# Patient Record
Sex: Female | Born: 1990 | Race: White | Hispanic: No | Marital: Single | Smoking: Never smoker
Health system: Southern US, Community
[De-identification: ages and names within clinical notes are randomized; demographics above are authoritative.]

## PROBLEM LIST (undated history)

## (undated) DIAGNOSIS — T7840XA Allergy, unspecified, initial encounter: Secondary | ICD-10-CM

## (undated) DIAGNOSIS — F32A Depression, unspecified: Secondary | ICD-10-CM

## (undated) DIAGNOSIS — F329 Major depressive disorder, single episode, unspecified: Secondary | ICD-10-CM

## (undated) DIAGNOSIS — F419 Anxiety disorder, unspecified: Secondary | ICD-10-CM

## (undated) HISTORY — DX: Allergy, unspecified, initial encounter: T78.40XA

## (undated) HISTORY — PX: WISDOM TOOTH EXTRACTION: SHX21

## (undated) HISTORY — DX: Depression, unspecified: F32.A

## (undated) HISTORY — DX: Anxiety disorder, unspecified: F41.9

## (undated) HISTORY — DX: Major depressive disorder, single episode, unspecified: F32.9

---

## 2010-11-14 ENCOUNTER — Inpatient Hospital Stay (INDEPENDENT_AMBULATORY_CARE_PROVIDER_SITE_OTHER)
Admission: RE | Admit: 2010-11-14 | Discharge: 2010-11-14 | Disposition: A | Discharge status: Home / Self Care | Payer: BC Managed Care – PPO | Source: Ambulatory Visit | Attending: Family Medicine | Admitting: Family Medicine

## 2010-11-14 DIAGNOSIS — J019 Acute sinusitis, unspecified: Secondary | ICD-10-CM

## 2010-11-14 DIAGNOSIS — J069 Acute upper respiratory infection, unspecified: Secondary | ICD-10-CM

## 2012-07-31 ENCOUNTER — Encounter: Payer: Self-pay | Admitting: Internal Medicine

## 2012-07-31 ENCOUNTER — Ambulatory Visit (INDEPENDENT_AMBULATORY_CARE_PROVIDER_SITE_OTHER): Payer: BC Managed Care – PPO | Admitting: Internal Medicine

## 2012-07-31 VITALS — BP 102/76 | HR 69 | Temp 98.3°F | Ht 68.75 in | Wt 259.0 lb

## 2012-07-31 DIAGNOSIS — F32A Depression, unspecified: Secondary | ICD-10-CM | POA: Insufficient documentation

## 2012-07-31 DIAGNOSIS — R635 Abnormal weight gain: Secondary | ICD-10-CM | POA: Insufficient documentation

## 2012-07-31 DIAGNOSIS — F329 Major depressive disorder, single episode, unspecified: Secondary | ICD-10-CM

## 2012-07-31 DIAGNOSIS — N946 Dysmenorrhea, unspecified: Secondary | ICD-10-CM

## 2012-07-31 LAB — LIPID PANEL
HDL: 57.7 mg/dL (ref >39.00)
Total CHOL/HDL Ratio: 3
VLDL: 15.8 mg/dL (ref 0.0–40.0)

## 2012-07-31 LAB — CBC WITH DIFFERENTIAL/PLATELET
Eosinophils Absolute: 0.3 10*3/uL (ref 0.0–0.7)
MCHC: 34.2 g/dL (ref 30.0–36.0)
MCV: 90.9 fl (ref 78.0–100.0)
Monocytes Absolute: 0.4 10*3/uL (ref 0.1–1.0)
Neutrophils Relative %: 60.9 % (ref 43.0–77.0)
Platelets: 276 10*3/uL (ref 150.0–400.0)
RDW: 13.4 % (ref 11.5–14.6)

## 2012-07-31 LAB — COMPREHENSIVE METABOLIC PANEL
ALT: 64 U/L — ABNORMAL HIGH (ref 0–35)
AST: 77 U/L — ABNORMAL HIGH (ref 0–37)
Albumin: 3.5 g/dL (ref 3.5–5.2)
Alkaline Phosphatase: 114 U/L (ref 39–117)
Glucose, Bld: 92 mg/dL (ref 70–99)
Potassium: 4.5 mEq/L (ref 3.5–5.1)
Sodium: 137 mEq/L (ref 135–145)
Total Protein: 6.9 g/dL (ref 6.0–8.3)

## 2012-07-31 LAB — HEMOGLOBIN A1C: Hgb A1c MFr Bld: 6 % (ref 4.6–6.5)

## 2012-07-31 LAB — TSH: TSH: 3.26 u[IU]/mL (ref 0.35–5.50)

## 2012-07-31 MED ORDER — DESOGESTREL-ETHINYL ESTRADIOL 0.15-30 MG-MCG PO TABS: 1.0000 | ORAL_TABLET | Freq: Every day | ORAL | Status: DC

## 2012-07-31 MED ORDER — FLUOXETINE HCL 20 MG PO CAPS: 20.0000 mg | ORAL_CAPSULE | Freq: Every day | ORAL | Status: DC

## 2012-07-31 MED ORDER — BUPROPION HCL ER (XL) 300 MG PO TB24
300.0000 mg | ORAL_TABLET | ORAL | Status: DC
Start: 2012-07-31 — End: 2012-11-06

## 2012-07-31 MED ORDER — LAMOTRIGINE 25 MG PO TABS: 25.0000 mg | ORAL_TABLET | Freq: Two times a day (BID) | ORAL | Status: DC

## 2012-07-31 NOTE — Assessment & Plan Note (Signed)
Will request records from previous psychiatrist. Will set up psychiatric referral locally. For now, continue current medications. Pt is comfortable with this plan.

## 2012-07-31 NOTE — Assessment & Plan Note (Signed)
Previous symptoms of dysmenorrhea, well-controlled with use of oral contraceptive. Will continue.

## 2012-07-31 NOTE — Progress Notes (Signed)
Subjective:    Patient ID: Deanna Ferguson, female    DOB: April 16, 1990, 22 y.o.   MRN: [id number]  HPI 22 year old female with history of anxiety, depression, and dysmenorrhea presents to establish care. She reports that symptoms of anxiety and depression have been poorly controlled on current medication. She was followed by a psychiatrist in [place] through her school [employer] 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 She would like to establish psychiatric care locally.  She also reports a history of dysmenorrhea for which she is taking oral contraceptives. She reports significant improvement in cramping and bleeding with use of oral contraceptive. She denies any side effects from this medication.  She is concerned about weight gain over the last 6 months. She reports "significant" weight gain despite maintaining the same diet. She is not physically active. She notes that stretch marks have developed on her arms and legs. She plans to start focusing more on her diet and exercise regimen when she moves home the summer.  Outpatient Encounter Prescriptions as of 07/31/2012  Medication Sig Dispense Refill  . buPROPion (WELLBUTRIN XL) 300 MG 24 hr tablet Take 1 tablet (300 mg total) by mouth every morning.  90 tablet  3  . desogestrel-ethinyl estradiol (APRI) 0.15-30 MG-MCG tablet Take 1 tablet by mouth daily.  3 Package  4  . FLUoxetine (PROZAC) 20 MG capsule Take 1 capsule (20 mg total) by mouth daily.  90 capsule  3  . lamoTRIgine (LAMICTAL) 25 MG tablet Take 1 tablet (25 mg total) by mouth 2 (two) times daily.  180 tablet  3   No facility-administered encounter medications on file as of 07/31/2012.   BP 102/76  Pulse 69  Temp(Src) 98.3 F (36.8 C) (Oral)  Ht 5' 8.75" (1.746 m)  Wt 259 lb (117.482 kg)  BMI 38.54 kg/m2  SpO2 98%  LMP 07/23/2012  Review of Systems  Constitutional: Negative for fever, chills, appetite change, fatigue and  unexpected weight change.  HENT: Negative for ear pain, congestion, sore throat, trouble swallowing, neck pain, voice change and sinus pressure.   Eyes: Negative for visual disturbance.  Respiratory: Negative for cough, shortness of breath, wheezing and stridor.   Cardiovascular: Negative for chest pain, palpitations and leg swelling.  Gastrointestinal: Negative for nausea, vomiting, abdominal pain, diarrhea, constipation, blood in stool, abdominal distention and anal bleeding.  Genitourinary: Negative for dysuria and flank pain.  Musculoskeletal: Negative for myalgias, arthralgias and gait problem.  Skin: Negative for color change and rash.  Neurological: Negative for dizziness and headaches.  Hematological: Negative for adenopathy. Does not bruise/bleed easily.  Psychiatric/Behavioral: Positive for dysphoric mood. Negative for suicidal ideas and sleep disturbance. The patient is nervous/anxious.        Objective:   Physical Exam  Constitutional: She is oriented to person, place, and time. She appears well-developed and well-nourished. No distress.  HENT:  Head: Normocephalic and atraumatic.  Right Ear: External ear normal.  Left Ear: External ear normal.  Nose: Nose normal.  Mouth/Throat: Oropharynx is clear and moist. No oropharyngeal exudate.  Eyes: Conjunctivae are normal. Pupils are equal, round, and reactive to light. Right eye exhibits no discharge. Left eye exhibits no discharge. No scleral icterus.  Neck: Normal range of motion. Neck supple. No tracheal deviation present. No thyromegaly present.  Cardiovascular: Normal rate, regular rhythm, normal heart sounds and intact distal pulses.  Exam reveals no gallop and no friction rub.   No murmur heard.  Pulmonary/Chest: Effort normal and breath sounds normal. No accessory muscle usage. Not tachypneic. No respiratory distress. She has no decreased breath sounds. She has no wheezes. She has no rhonchi. She has no rales. She exhibits no  tenderness.  Musculoskeletal: Normal range of motion. She exhibits no edema and no tenderness.  Lymphadenopathy:    She has no cervical adenopathy.  Neurological: She is alert and oriented to person, place, and time. No cranial nerve deficit. She exhibits normal muscle tone. Coordination normal.  Skin: Skin is warm and dry. No rash noted. She is not diaphoretic. No erythema. No pallor.  Psychiatric: She has a normal mood and affect. Her behavior is normal. Judgment and thought content normal.          Assessment & Plan:

## 2012-07-31 NOTE — Assessment & Plan Note (Signed)
Wt Readings from Last 3 Encounters:  07/31/12 259 lb (117.482 kg)   Body mass index is 38.54 kg/(m^2). Patient notes recent weight gain. Will check thyroid function with labs. Encouraged healthy diet low in processed carbohydrates. Encouraged regular physical activity with a goal of 30 minutes 5 days per week. Discussed potential referral for nutrition counseling.

## 2012-08-04 ENCOUNTER — Telehealth: Payer: Self-pay | Admitting: *Deleted

## 2012-08-04 NOTE — Telephone Encounter (Signed)
Patient mother called, stating that she also had some elevated liver enzymes and the fatty liver but when she did the U/S it was normal. Feel like it is a wasted test, no reason to have test done at this time. Would like to wait until her next office visit and repeat labs before proceeding with this. She has testing and classes to attend to and does not think this is a good time to do this. Her daughter is aware she called, but if you do not feel comfortable addressing this with her then we can call and discuss with her daughter.

## 2012-08-04 NOTE — Telephone Encounter (Signed)
We should communicate with the patient. She is 22 years old. She has elevated liver enzymes. There are many potential causes for this. At the minimum, we should repeat liver function tests in 2-4 weeks. If no improvement, we will need to do additional workup.

## 2012-08-05 NOTE — Telephone Encounter (Signed)
Left message to call back  

## 2012-08-07 ENCOUNTER — Encounter: Payer: Self-pay | Admitting: Emergency Medicine

## 2012-08-10 NOTE — Telephone Encounter (Signed)
Patient informed and verbally agreed to come in for the blood work then decide from there. She is in school that is why she was a little hesitant on the ultrasound.

## 2012-08-20 ENCOUNTER — Ambulatory Visit (INDEPENDENT_AMBULATORY_CARE_PROVIDER_SITE_OTHER): Payer: BC Managed Care – PPO | Admitting: Internal Medicine

## 2012-08-20 ENCOUNTER — Encounter: Payer: Self-pay | Admitting: Internal Medicine

## 2012-08-20 VITALS — BP 118/82 | HR 91 | Temp 98.7°F | Wt 259.0 lb

## 2012-08-20 DIAGNOSIS — J069 Acute upper respiratory infection, unspecified: Secondary | ICD-10-CM | POA: Insufficient documentation

## 2012-08-20 MED ORDER — AMOXICILLIN-POT CLAVULANATE 875-125 MG PO TABS: 1.0000 | ORAL_TABLET | Freq: Two times a day (BID) | ORAL | Status: DC

## 2012-08-20 MED ORDER — HYDROCOD POLST-CHLORPHEN POLST 10-8 MG/5ML PO LQCR: 5.0000 mL | Freq: Two times a day (BID) | ORAL | Status: DC | PRN

## 2012-08-20 NOTE — Assessment & Plan Note (Signed)
Symptoms consistent with viral URI. Rapid strep negative. Encouraged rest, supportive care. Ibuprofen or tylenol for fever or pain. Tussionex for cough. However, given pt h/o strep throat and upcoming Holiday weekend, will get Rx for Augmentin for her to take with her. If symptoms worsen, or if fever recurs, then will have pt start antibiotics and send [place] an email or call to let us know.

## 2012-08-20 NOTE — Progress Notes (Signed)
  Subjective:    Patient ID: Deanna Ferguson, female    DOB: [birth year], 22 y.o.   MRN: [id number]  HPI 22YO female presents for acute visit c/o 3 days of sore throat, fever, nasal congestion, non-productive cough. Fever resolved after first 24hr. No trouble swallowing. Tolerating normal diet. No chest pain, dyspnea. Not taking any medications currently. Took Nyquil initially.  Outpatient Prescriptions Prior to Visit  Medication Sig Dispense Refill  . buPROPion (WELLBUTRIN XL) 300 MG 24 hr tablet Take 1 tablet (300 mg total) by mouth every morning.  90 tablet  3  . desogestrel-ethinyl estradiol (APRI) 0.15-30 MG-MCG tablet Take 1 tablet by mouth daily.  3 Package  4  . FLUoxetine (PROZAC) 20 MG capsule Take 1 capsule (20 mg total) by mouth daily.  90 capsule  3  . lamoTRIgine (LAMICTAL) 25 MG tablet Take 1 tablet (25 mg total) by mouth 2 (two) times daily.  180 tablet  3   No facility-administered medications prior to visit.    Review of Systems  Constitutional: Positive for fever and fatigue. Negative for chills and unexpected weight change.  HENT: Positive for congestion, sore throat and postnasal drip. Negative for hearing loss, ear pain, nosebleeds, facial swelling, rhinorrhea, sneezing, mouth sores, trouble swallowing, neck pain, neck stiffness, voice change, sinus pressure, tinnitus and ear discharge.   Eyes: Negative for pain, discharge, redness and visual disturbance.  Respiratory: Positive for cough. Negative for chest tightness, shortness of breath, wheezing and stridor.   Cardiovascular: Negative for chest pain, palpitations and leg swelling.  Musculoskeletal: Negative for myalgias and arthralgias.  Skin: Negative for color change and rash.  Neurological: Negative for dizziness, weakness, light-headedness and headaches.  Hematological: Negative for adenopathy.       Objective:   Physical Exam  Constitutional: She is oriented to person, place, and time. She appears  well-developed and well-nourished. No distress.  HENT:  Head: Normocephalic and atraumatic.  Right Ear: External ear normal.  Left Ear: External ear normal.  Nose: Nose normal.  Mouth/Throat: Oropharyngeal exudate and posterior oropharyngeal erythema present.  Eyes: Conjunctivae are normal. Pupils are equal, round, and reactive to light. Right eye exhibits no discharge. Left eye exhibits no discharge. No scleral icterus.  Neck: Normal range of motion. Neck supple. No tracheal deviation present. No thyromegaly present.  Cardiovascular: Normal rate, regular rhythm, normal heart sounds and intact distal pulses.  Exam reveals no gallop and no friction rub.   No murmur heard. Pulmonary/Chest: Effort normal and breath sounds normal. No accessory muscle usage. Not tachypneic. No respiratory distress. She has no decreased breath sounds. She has no wheezes. She has no rhonchi. She has no rales. She exhibits no tenderness.  Musculoskeletal: Normal range of motion. She exhibits no edema and no tenderness.  Lymphadenopathy:    She has no cervical adenopathy.  Neurological: She is alert and oriented to person, place, and time. No cranial nerve deficit. She exhibits normal muscle tone. Coordination normal.  Skin: Skin is warm and dry. No rash noted. She is not diaphoretic. No erythema. No pallor.  Psychiatric: She has a normal mood and affect. Her behavior is normal. Judgment and thought content normal.          Assessment & Plan:

## 2012-11-04 ENCOUNTER — Ambulatory Visit (INDEPENDENT_AMBULATORY_CARE_PROVIDER_SITE_OTHER): Payer: BC Managed Care – PPO | Admitting: *Deleted

## 2012-11-04 DIAGNOSIS — Z111 Encounter for screening for respiratory tuberculosis: Secondary | ICD-10-CM

## 2012-11-06 ENCOUNTER — Ambulatory Visit (INDEPENDENT_AMBULATORY_CARE_PROVIDER_SITE_OTHER): Payer: BC Managed Care – PPO | Admitting: Internal Medicine

## 2012-11-06 ENCOUNTER — Encounter: Payer: Self-pay | Admitting: Internal Medicine

## 2012-11-06 VITALS — BP 108/80 | HR 86 | Temp 98.3°F | Ht 68.75 in | Wt 260.0 lb

## 2012-11-06 DIAGNOSIS — N946 Dysmenorrhea, unspecified: Secondary | ICD-10-CM

## 2012-11-06 DIAGNOSIS — E669 Obesity, unspecified: Secondary | ICD-10-CM

## 2012-11-06 DIAGNOSIS — Z0000 Encounter for general adult medical examination without abnormal findings: Secondary | ICD-10-CM

## 2012-11-06 MED ORDER — DESOGESTREL-ETHINYL ESTRADIOL 0.15-30 MG-MCG PO TABS: 1.0000 | ORAL_TABLET | Freq: Every day | ORAL | Status: DC

## 2012-11-06 NOTE — Assessment & Plan Note (Signed)
Encouraged her to keep a food diary with goal of caloric restriction and weight loss. Discussed healthy, Mediterranean style diet. Encouraged regular physical activity 40 minutes of aerobic activity at least 3 times per week.

## 2012-11-06 NOTE — Progress Notes (Signed)
Subjective:    Patient ID: Deanna Ferguson, female    DOB: Jun 17, 1990, 22 y.o.   MRN: [id number]  HPI 22 year old female presents for annual exam. She presents with her mother today. She reports that she is generally feeling well. She continues to undergo care for depression with a psychiatrist. She reports symptoms have been well-controlled on current medications. 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 She continues to live at home. She is not following any particular diet. She does not exercise. No concerns today.  Outpatient Encounter Prescriptions as of 11/06/2012  Medication Sig Dispense Refill  . buPROPion (WELLBUTRIN SR) 150 MG 12 hr tablet Take 150 mg by mouth daily.      Marland Kitchen desogestrel-ethinyl estradiol (APRI) 0.15-30 MG-MCG tablet Take 1 tablet by mouth daily.  3 Package  4  . lamoTRIgine (LAMICTAL) 25 MG tablet Take 1 tablet (25 mg total) by mouth 2 (two) times daily.  180 tablet  3   No facility-administered encounter medications on file as of 11/06/2012.   BP 108/80  Pulse 86  Temp(Src) 98.3 F (36.8 C) (Oral)  Ht 5' 8.75" (1.746 m)  Wt 260 lb (117.935 kg)  BMI 38.69 kg/m2  SpO2 97%  Review of Systems  Constitutional: Negative for fever, chills, appetite change, fatigue and unexpected weight change.  HENT: Negative for ear pain, congestion, sore throat, trouble swallowing, neck pain, voice change and sinus pressure.   Eyes: Negative for visual disturbance.  Respiratory: Negative for cough, shortness of breath, wheezing and stridor.   Cardiovascular: Negative for chest pain, palpitations and leg swelling.  Gastrointestinal: Negative for nausea, vomiting, abdominal pain, diarrhea, constipation, blood in stool, abdominal distention and anal bleeding.  Genitourinary: Negative for dysuria and flank pain.  Musculoskeletal: Negative for myalgias, arthralgias and gait problem.  Skin: Negative for color change and rash.  Neurological: Negative for  dizziness and headaches.  Hematological: Negative for adenopathy. Does not bruise/bleed easily.  Psychiatric/Behavioral: Negative for suicidal ideas, sleep disturbance and dysphoric mood. The patient is not nervous/anxious.        Objective:   Physical Exam  Constitutional: She is oriented to person, place, and time. She appears well-developed and well-nourished. No distress.  HENT:  Head: Normocephalic and atraumatic.  Right Ear: External ear normal.  Left Ear: External ear normal.  Nose: Nose normal.  Mouth/Throat: Oropharynx is clear and moist. No oropharyngeal exudate.  Eyes: Conjunctivae are normal. Pupils are equal, round, and reactive to light. Right eye exhibits no discharge. Left eye exhibits no discharge. No scleral icterus.  Neck: Normal range of motion. Neck supple. No tracheal deviation present. No thyromegaly present.  Cardiovascular: Normal rate, regular rhythm, normal heart sounds and intact distal pulses.  Exam reveals no gallop and no friction rub.   No murmur heard. Pulmonary/Chest: Effort normal and breath sounds normal. No accessory muscle usage. Not tachypneic. No respiratory distress. She has no decreased breath sounds. She has no wheezes. She has no rales. She exhibits no tenderness. Right breast exhibits no inverted nipple, no mass, no nipple discharge, no skin change and no tenderness. Left breast exhibits no inverted nipple, no mass, no nipple discharge, no skin change and no tenderness. Breasts are symmetrical.  Abdominal: Soft. Bowel sounds are normal. She exhibits no distension and no mass. There is no tenderness. There is no rebound and no guarding.  Musculoskeletal: Normal range of motion. She exhibits no edema and no tenderness.  Lymphadenopathy:    She has no cervical adenopathy.  Neurological: She is alert and oriented to person, place, and time. No cranial nerve deficit. She exhibits normal muscle tone. Coordination normal.  Skin: Skin is warm and dry. No  rash noted. She is not diaphoretic. No erythema. No pallor.  Psychiatric: She has a normal mood and affect. Her behavior is normal. Judgment and thought content normal.          Assessment & Plan:

## 2012-11-06 NOTE — Assessment & Plan Note (Signed)
General medical exam normal today including breast exam. Patient declines Pap and pelvic exam. Encouraged to reconsider this given the potential risk of cervical cancer. Immunizations are up-to-date. Recent TB test was negative. Encouraged healthy diet and regular physical activity with goal of weight loss. Follow up 1 year and prn.

## 2013-03-17 ENCOUNTER — Emergency Department: Payer: Self-pay | Admitting: Internal Medicine

## 2013-03-17 LAB — COMPREHENSIVE METABOLIC PANEL
ALT: 160 U/L — AB (ref 12–78)
Albumin: 3.4 g/dL (ref 3.4–5.0)
Alkaline Phosphatase: 157 U/L — ABNORMAL HIGH
Anion Gap: 5 — ABNORMAL LOW (ref 7–16)
BUN: 9 mg/dL (ref 7–18)
Bilirubin,Total: 0.3 mg/dL (ref 0.2–1.0)
CALCIUM: 9 mg/dL (ref 8.5–10.1)
Chloride: 106 mmol/L (ref 98–107)
Co2: 24 mmol/L (ref 21–32)
Creatinine: 0.71 mg/dL (ref 0.60–1.30)
EGFR (African American): >60
Glucose: 87 mg/dL (ref 65–99)
Osmolality: 268 (ref 275–301)
POTASSIUM: 4.1 mmol/L (ref 3.5–5.1)
SGOT(AST): 161 U/L — ABNORMAL HIGH (ref 15–37)
Sodium: 135 mmol/L — ABNORMAL LOW (ref 136–145)
Total Protein: 8 g/dL (ref 6.4–8.2)

## 2013-03-17 LAB — CBC
HCT: 38.3 % (ref 35.0–47.0)
HGB: 13 g/dL (ref 12.0–16.0)
MCH: 31 pg (ref 26.0–34.0)
MCHC: 34 g/dL (ref 32.0–36.0)
MCV: 91 fL (ref 80–100)
Platelet: 284 10*3/uL (ref 150–440)
RBC: 4.2 10*6/uL (ref 3.80–5.20)
RDW: 13.6 % (ref 11.5–14.5)
WBC: 6.4 10*3/uL (ref 3.6–11.0)

## 2013-03-17 LAB — TROPONIN I

## 2013-04-15 ENCOUNTER — Ambulatory Visit: Payer: BC Managed Care – PPO | Admitting: Internal Medicine

## 2013-04-26 ENCOUNTER — Ambulatory Visit (INDEPENDENT_AMBULATORY_CARE_PROVIDER_SITE_OTHER): Payer: BC Managed Care – PPO | Admitting: Internal Medicine

## 2013-04-26 ENCOUNTER — Encounter: Payer: Self-pay | Admitting: Internal Medicine

## 2013-04-26 VITALS — BP 108/80 | HR 89 | Temp 97.6°F | Wt 267.0 lb

## 2013-04-26 DIAGNOSIS — R7989 Other specified abnormal findings of blood chemistry: Secondary | ICD-10-CM | POA: Insufficient documentation

## 2013-04-26 DIAGNOSIS — R932 Abnormal findings on diagnostic imaging of liver and biliary tract: Secondary | ICD-10-CM | POA: Insufficient documentation

## 2013-04-26 DIAGNOSIS — R945 Abnormal results of liver function studies: Principal | ICD-10-CM

## 2013-04-26 DIAGNOSIS — R079 Chest pain, unspecified: Secondary | ICD-10-CM | POA: Insufficient documentation

## 2013-04-26 NOTE — Assessment & Plan Note (Signed)
Recent liver ultrasound showed 2.8cm echogenic lesion in caudate lobe, likely fatty infiltration versus hemangioma. Will plan to set up MRI for better characterization. Will also repeat LFTs.

## 2013-04-26 NOTE — Assessment & Plan Note (Signed)
Recommended repeat LFTs today. Pt prefers to wait until another time because of fear of needles. She will come in later this week.

## 2013-04-26 NOTE — Progress Notes (Signed)
Subjective:    Patient ID: Deanna Ferguson, female    DOB: [birth year], 23 y.o.   MRN: [id number]  HPI 23YO female presents with her mother for follow up after recent ED visit.  Injured at work 1/28th when child threw basketball and it hit her chest. Had chest pain. Went to ED. Evaluation was normal except for elevated LFTs, per her report. Had RUQ [place] which showed echogenic lesion in the caudate lobe of liver measuring 2.8cm. No nausea, vomiting. Occasional cough. No dyspnea. Chest pain has completely resolved.No fever, chills. Recently had upper respiratory infection with cough, congestion. Symptoms have been improving, except for occasional dry cough. Not taking anything for this.    Review of Systems  Constitutional: Negative for fever, chills, appetite change, fatigue and unexpected weight change.  HENT: Negative for congestion, ear pain, sinus pressure, sore throat, trouble swallowing and voice change.   Eyes: Negative for visual disturbance.  Respiratory: Positive for cough (occasaionl dry cough last couple of weeks). Negative for shortness of breath, wheezing and stridor.   Cardiovascular: Negative for chest pain, palpitations and leg swelling.  Gastrointestinal: Negative for nausea, vomiting, abdominal pain, diarrhea, constipation, blood in stool, abdominal distention and anal bleeding.  Genitourinary: Negative for dysuria and flank pain.  Musculoskeletal: Negative for arthralgias, gait problem, myalgias and neck pain.  Skin: Negative for color change and rash.  Neurological: Negative for dizziness and headaches.  Hematological: Negative for adenopathy. Does not bruise/bleed easily.  Psychiatric/Behavioral: Negative for suicidal ideas, sleep disturbance and dysphoric mood. The patient is not nervous/anxious.        Objective:    BP 108/80  Pulse 89  Temp(Src) 97.6 F (36.4 C) (Oral)  Wt 267 lb (121.11 kg)  SpO2 98% Physical Exam  Constitutional: She is oriented to  person, place, and time. She appears well-developed and well-nourished. No distress.  HENT:  Head: Normocephalic and atraumatic.  Right Ear: External ear normal.  Left Ear: External ear normal.  Nose: Nose normal.  Mouth/Throat: Oropharynx is clear and moist. No oropharyngeal exudate.  Eyes: Conjunctivae are normal. Pupils are equal, round, and reactive to light. Right eye exhibits no discharge. Left eye exhibits no discharge. No scleral icterus.  Neck: Normal range of motion. Neck supple. No tracheal deviation present. No thyromegaly present.  Cardiovascular: Normal rate, regular rhythm, normal heart sounds and intact distal pulses.  Exam reveals no gallop and no friction rub.   No murmur heard. Pulmonary/Chest: Effort normal and breath sounds normal. No accessory muscle usage. Not tachypneic. No respiratory distress. She has no decreased breath sounds. She has no wheezes. She has no rhonchi. She has no rales. She exhibits no tenderness.  Abdominal: Soft. Bowel sounds are normal. She exhibits no distension and no mass. There is no hepatosplenomegaly. There is no tenderness. There is no rebound and no guarding.  Musculoskeletal: Normal range of motion. She exhibits no edema and no tenderness.  Lymphadenopathy:    She has no cervical adenopathy.  Neurological: She is alert and oriented to person, place, and time. No cranial nerve deficit. She exhibits normal muscle tone. Coordination normal.  Skin: Skin is warm and dry. No rash noted. She is not diaphoretic. No erythema. No pallor.  Psychiatric: Her speech is normal and behavior is normal. Judgment and thought content normal. Her mood appears anxious. Cognition and memory are normal.          Assessment & Plan:   Problem List Items Addressed This Visit   Abnormal liver  ultrasound     Recent liver ultrasound showed 2.8cm echogenic lesion in caudate lobe, likely fatty infiltration versus hemangioma. Will plan to set up MRI for better  characterization. Will also repeat LFTs.    Relevant Orders      MR Liver W Contrast   Chest pain     Pain has completely resolved. Chest wall contusion after being hit by basketball. Will request labs and evaluation from [facility] ED.    Elevated LFTs - Primary     Recommended repeat LFTs today. Pt prefers to wait until another time because of fear of needles. She will come in later this week.    Relevant Orders      Comp Met (CMET)       Return in about 3 months (around 07/27/2013).

## 2013-04-26 NOTE — Progress Notes (Signed)
Pre visit review using our clinic review tool, if applicable. No additional management support is needed unless otherwise documented below in the visit note. 

## 2013-04-26 NOTE — Assessment & Plan Note (Signed)
Pain has completely resolved. Chest wall contusion after being hit by basketball. Will request labs and evaluation from [facility] ED.

## 2013-04-30 ENCOUNTER — Other Ambulatory Visit: Payer: Self-pay | Admitting: *Deleted

## 2013-04-30 ENCOUNTER — Telehealth: Payer: Self-pay | Admitting: *Deleted

## 2013-04-30 DIAGNOSIS — R740 Nonspecific elevation of levels of transaminase and lactic acid dehydrogenase [LDH]: Principal | ICD-10-CM

## 2013-04-30 DIAGNOSIS — R7402 Elevation of levels of lactic acid dehydrogenase (LDH): Secondary | ICD-10-CM

## 2013-04-30 NOTE — Telephone Encounter (Signed)
Pt is coming in on 03.16.2015 what labs and dx?  

## 2013-04-30 NOTE — Telephone Encounter (Signed)
CMP for 790.4

## 2013-05-03 ENCOUNTER — Other Ambulatory Visit (INDEPENDENT_AMBULATORY_CARE_PROVIDER_SITE_OTHER): Payer: BC Managed Care – PPO

## 2013-05-03 DIAGNOSIS — R7401 Elevation of levels of liver transaminase levels: Secondary | ICD-10-CM

## 2013-05-03 DIAGNOSIS — R740 Nonspecific elevation of levels of transaminase and lactic acid dehydrogenase [LDH]: Principal | ICD-10-CM

## 2013-05-03 DIAGNOSIS — R7402 Elevation of levels of lactic acid dehydrogenase (LDH): Secondary | ICD-10-CM

## 2013-05-03 LAB — COMPREHENSIVE METABOLIC PANEL
ALT: 34 U/L (ref 0–35)
AST: 44 U/L — AB (ref 0–37)
Albumin: 3.8 g/dL (ref 3.5–5.2)
Alkaline Phosphatase: 102 U/L (ref 39–117)
BILIRUBIN TOTAL: 0.3 mg/dL (ref 0.3–1.2)
BUN: 15 mg/dL (ref 6–23)
CO2: 25 mEq/L (ref 19–32)
CREATININE: 0.8 mg/dL (ref 0.4–1.2)
Calcium: 9.3 mg/dL (ref 8.4–10.5)
Chloride: 106 mEq/L (ref 96–112)
GFR: 97.45 mL/min (ref >60.00)
Glucose, Bld: 95 mg/dL (ref 70–99)
Potassium: 3.8 mEq/L (ref 3.5–5.1)
Sodium: 139 mEq/L (ref 135–145)
TOTAL PROTEIN: 7.6 g/dL (ref 6.0–8.3)

## 2013-05-07 ENCOUNTER — Telehealth: Payer: Self-pay | Admitting: Internal Medicine

## 2013-05-07 ENCOUNTER — Ambulatory Visit: Payer: Self-pay | Admitting: Internal Medicine

## 2013-05-07 NOTE — Telephone Encounter (Signed)
MRI abdomen was normal

## 2013-06-14 ENCOUNTER — Encounter: Payer: Self-pay | Admitting: Internal Medicine

## 2013-09-30 ENCOUNTER — Other Ambulatory Visit: Payer: Self-pay | Admitting: Internal Medicine

## 2013-11-05 ENCOUNTER — Encounter: Payer: Self-pay | Admitting: Internal Medicine

## 2013-11-11 ENCOUNTER — Encounter: Payer: Self-pay | Admitting: Internal Medicine

## 2013-11-11 ENCOUNTER — Ambulatory Visit (INDEPENDENT_AMBULATORY_CARE_PROVIDER_SITE_OTHER): Payer: BC Managed Care – PPO | Admitting: Internal Medicine

## 2013-11-11 VITALS — BP 112/72 | HR 90 | Temp 98.0°F | Resp 16 | Ht 69.0 in | Wt 276.0 lb

## 2013-11-11 DIAGNOSIS — N946 Dysmenorrhea, unspecified: Secondary | ICD-10-CM

## 2013-11-11 DIAGNOSIS — Z0000 Encounter for general adult medical examination without abnormal findings: Secondary | ICD-10-CM

## 2013-11-11 DIAGNOSIS — F410 Panic disorder [episodic paroxysmal anxiety] without agoraphobia: Secondary | ICD-10-CM

## 2013-11-11 DIAGNOSIS — G43909 Migraine, unspecified, not intractable, without status migrainosus: Secondary | ICD-10-CM

## 2013-11-11 DIAGNOSIS — E669 Obesity, unspecified: Secondary | ICD-10-CM

## 2013-11-11 MED ORDER — DESOGESTREL-ETHINYL ESTRADIOL 0.15-30 MG-MCG PO TABS
1.0000 | ORAL_TABLET | Freq: Every day | ORAL | Status: DC
Start: 2013-11-11 — End: 2015-03-13

## 2013-11-11 NOTE — Progress Notes (Signed)
Subjective:    Patient ID: Deanna Ferguson, female    DOB: 08-27-1990, 23 y.o.   MRN: [id number]  HPI 23YO female presents for physical exam. Started seeing a therapist. Diagnosed with panic disorder and PTSD. Using a service dog who alerts her to impending panic attacks.  Having severe migraines typically twice per week. Headaches occur in late afternoon. Typically with pain in front of head. Positive photo and phonophobia.  Feels nauseous during headache. Takes Excedrine with some improvement.  Declines pelvic exam today, feels too anxious. Would like to consider pelvic exam under anesthesia.  Review of Systems  Constitutional: Negative for fever, chills, appetite change, fatigue and unexpected weight change.  Eyes: Negative for visual disturbance.  Respiratory: Negative for shortness of breath.   Cardiovascular: Negative for chest pain and leg swelling.  Gastrointestinal: Negative for nausea, vomiting, abdominal pain, diarrhea and constipation.  Genitourinary: Negative for menstrual problem.  Musculoskeletal: Negative for arthralgias and myalgias.  Skin: Negative for color change and rash.  Hematological: Negative for adenopathy. Does not bruise/bleed easily.  Psychiatric/Behavioral: Negative for suicidal ideas, sleep disturbance and dysphoric mood. The patient is nervous/anxious.        Objective:    BP 112/72  Pulse 90  Temp(Src) 98 F (36.7 C) (Oral)  Resp 16  Ht  (1.753 m)  Wt 276 lb (125.193 kg)  BMI 40.74 kg/m2  SpO2 98% Physical Exam  Constitutional: She is oriented to person, place, and time. She appears well-developed and well-nourished. No distress.  HENT:  Head: Normocephalic and atraumatic.  Right Ear: External ear normal.  Left Ear: External ear normal.  Nose: Nose normal.  Mouth/Throat: Oropharynx is clear and moist. No oropharyngeal exudate.  Eyes: Conjunctivae are normal. Pupils are equal, round, and reactive to light. Right eye exhibits  no discharge. Left eye exhibits no discharge. No scleral icterus.  Neck: Normal range of motion. Neck supple. No tracheal deviation present. No thyromegaly present.  Cardiovascular: Normal rate, regular rhythm, normal heart sounds and intact distal pulses.  Exam reveals no gallop and no friction rub.   No murmur heard. Pulmonary/Chest: Effort normal and breath sounds normal. No accessory muscle usage. Not tachypneic. No respiratory distress. She has no decreased breath sounds. She has no wheezes. She has no rhonchi. She has no rales. She exhibits no tenderness.  Musculoskeletal: Normal range of motion. She exhibits no edema and no tenderness.  Lymphadenopathy:    She has no cervical adenopathy.  Neurological: She is alert and oriented to person, place, and time. No cranial nerve deficit. She exhibits normal muscle tone. Coordination normal.  Skin: Skin is warm and dry. No rash noted. She is not diaphoretic. No erythema. No pallor.  Psychiatric: Her speech is normal and behavior is normal. Judgment and thought content normal. Her mood appears anxious. Cognition and memory are normal.          Assessment & Plan:   Problem List Items Addressed This Visit     Unprioritized   Dysmenorrhea   Migraine, unspecified, without mention of intractable migraine without mention of status migrainosus     Recent episodes of migraine type headaches. I am concerned that this may be related to increase in dose of Lamictal. We discussed talking with her psychiatrist about this. We also discuss imaging with MRI brain and we discussed referral to neurology. I am reluctant to add medication given her severe panic disorder. She will discuss with her mother and get back to [place] by phone  or email.    Relevant Medications      lamoTRIgine (LAMICTAL) 25 MG tablet   Obesity (BMI 30-39.9)      Wt Readings from Last 3 Encounters:  11/11/13 276 lb (125.193 kg)  04/26/13 267 lb (121.11 kg)  11/06/12 260 lb (117.935 kg)    Body mass index is 40.74 kg/(m^2).   Encouraged healthy diet and exercise with goal of weight loss.    Panic disorder     Encouraged regular follow up with her psychiatrist. Continue current medications. Letter written today to explain need for service dog.    Routine general medical examination at a health care facility - Primary     General medical exam today normal. Pt declines pelvic exam and PAP given severe anxiety. Will set up referral to GYN for possible exam under anesthesia. She also declines labwork today given severe anxiety about venipunction. Flu vaccine declined.    Relevant Medications      desogestrel-ethinyl estradiol (APRI) 0.15-30 MG-MCG tablet   Other Relevant Orders      Ambulatory referral to Gynecology       Return in about 4 weeks (around 12/09/2013) for Recheck.

## 2013-11-11 NOTE — Assessment & Plan Note (Signed)
Encouraged regular follow up with her psychiatrist. Continue current medications. Letter written today to explain need for service dog.

## 2013-11-11 NOTE — Assessment & Plan Note (Signed)
General medical exam today normal. Pt declines pelvic exam and PAP given severe anxiety. Will set up referral to GYN for possible exam under anesthesia. She also declines labwork today given severe anxiety about venipunction. Flu vaccine declined.

## 2013-11-11 NOTE — Assessment & Plan Note (Signed)
Wt Readings from Last 3 Encounters:  11/11/13 276 lb (125.193 kg)  04/26/13 267 lb (121.11 kg)  11/06/12 260 lb (117.935 kg)   Body mass index is 40.74 kg/(m^2).   Encouraged healthy diet and exercise with goal of weight loss.

## 2013-11-11 NOTE — Assessment & Plan Note (Signed)
Recent episodes of migraine type headaches. I am concerned that this may be related to increase in dose of Lamictal. We discussed talking with her psychiatrist about this. We also discuss imaging with MRI brain and we discussed referral to neurology. I am reluctant to add medication given her severe panic disorder. She will discuss with her mother and get back to [place] by phone or email.

## 2013-11-11 NOTE — Patient Instructions (Signed)

## 2013-11-21 ENCOUNTER — Encounter: Payer: Self-pay | Admitting: Internal Medicine

## 2014-04-30 ENCOUNTER — Encounter: Payer: Self-pay | Admitting: Internal Medicine

## 2015-03-12 ENCOUNTER — Encounter: Payer: Self-pay | Admitting: Internal Medicine

## 2015-03-13 ENCOUNTER — Other Ambulatory Visit: Payer: Self-pay

## 2015-03-13 DIAGNOSIS — Z0000 Encounter for general adult medical examination without abnormal findings: Secondary | ICD-10-CM

## 2015-03-13 MED ORDER — DESOGESTREL-ETHINYL ESTRADIOL 0.15-30 MG-MCG PO TABS: 1.0000 | ORAL_TABLET | Freq: Every day | ORAL | Status: AC

## 2018-05-27 ENCOUNTER — Other Ambulatory Visit: Payer: Self-pay | Admitting: Internal Medicine

## 2018-05-27 DIAGNOSIS — R748 Abnormal levels of other serum enzymes: Secondary | ICD-10-CM

## 2018-06-29 ENCOUNTER — Ambulatory Visit
Admission: RE | Admit: 2018-06-29 | Discharge: 2018-06-29 | Disposition: A | Discharge status: Home / Self Care | Payer: BC Managed Care – PPO | Source: Ambulatory Visit | Attending: Internal Medicine | Admitting: Internal Medicine

## 2018-06-29 ENCOUNTER — Other Ambulatory Visit: Payer: Self-pay

## 2018-06-29 DIAGNOSIS — R748 Abnormal levels of other serum enzymes: Secondary | ICD-10-CM | POA: Insufficient documentation

## 2019-04-22 ENCOUNTER — Ambulatory Visit: Payer: BC Managed Care – PPO | Attending: Internal Medicine

## 2019-04-22 ENCOUNTER — Other Ambulatory Visit: Payer: Self-pay

## 2019-04-22 DIAGNOSIS — Z23 Encounter for immunization: Secondary | ICD-10-CM | POA: Insufficient documentation

## 2019-04-22 NOTE — Progress Notes (Signed)
   Covid-19 Vaccination Clinic  Name:  Deanna Ferguson    MRN: [id number] DOB: Jul 25, 1990  04/22/2019  Deanna Ferguson was observed post Covid-19 immunization for 15 minutes without incident. She was provided with Vaccine Information Sheet and instruction to access the V-Safe system.   Deanna Ferguson was instructed to call 911 with any severe reactions post vaccine: Marland Kitchen Difficulty breathing  . Swelling of face and throat  . A fast heartbeat  . A bad rash all over body  . Dizziness and weakness   Immunizations Administered    Name Date Dose VIS Date Route   Pfizer COVID-19 Vaccine 04/22/2019 10:43 AM 0.3 mL 01/29/2019 Intramuscular   Manufacturer: [employer], [employer]   Lot: [id number]   NDC: [address]-2

## 2019-05-15 ENCOUNTER — Ambulatory Visit: Payer: BC Managed Care – PPO

## 2019-05-18 ENCOUNTER — Ambulatory Visit: Payer: BC Managed Care – PPO | Attending: Internal Medicine

## 2019-05-18 DIAGNOSIS — Z23 Encounter for immunization: Secondary | ICD-10-CM

## 2019-05-18 NOTE — Progress Notes (Signed)
   Covid-19 Vaccination Clinic  Name:  Keyonna Comunale    MRN: [id number] DOB: 20-Jan-1991  05/18/2019  Ms. Salmon was observed post Covid-19 immunization for 15 minutes without incident. She was provided with Vaccine Information Sheet and instruction to access the V-Safe system.   Ms. Gassner was instructed to call 911 with any severe reactions post vaccine: Marland Kitchen Difficulty breathing  . Swelling of face and throat  . A fast heartbeat  . A bad rash all over body  . Dizziness and weakness   Immunizations Administered    Name Date Dose VIS Date Route   Pfizer COVID-19 Vaccine 05/18/2019  1:28 PM 0.3 mL 01/29/2019 Intramuscular   Manufacturer: [employer], [employer]   Lot: [id number]   NDC: [address]-8

## 2019-12-27 ENCOUNTER — Other Ambulatory Visit: Payer: Self-pay | Admitting: Internal Medicine

## 2019-12-27 DIAGNOSIS — R748 Abnormal levels of other serum enzymes: Secondary | ICD-10-CM

## 2019-12-30 ENCOUNTER — Other Ambulatory Visit: Payer: Self-pay

## 2019-12-30 ENCOUNTER — Ambulatory Visit
Admission: RE | Admit: 2019-12-30 | Discharge: 2019-12-30 | Disposition: A | Discharge status: Home / Self Care | Payer: BC Managed Care – PPO | Source: Ambulatory Visit | Attending: Internal Medicine | Admitting: Internal Medicine

## 2019-12-30 DIAGNOSIS — R748 Abnormal levels of other serum enzymes: Secondary | ICD-10-CM | POA: Insufficient documentation

## 2022-06-08 IMAGING — US US ABDOMEN LIMITED
1 series · 14 of 25 positions shown · non-contrast
Comparison: 06/29/2018

CLINICAL DATA: Elevated LFTs

EXAM:
ULTRASOUND ABDOMEN LIMITED RIGHT UPPER QUADRANT

[Series 1: us abdomen limited · 0.26mm/px · 14 of 36 slices shown]
[im 1/36]
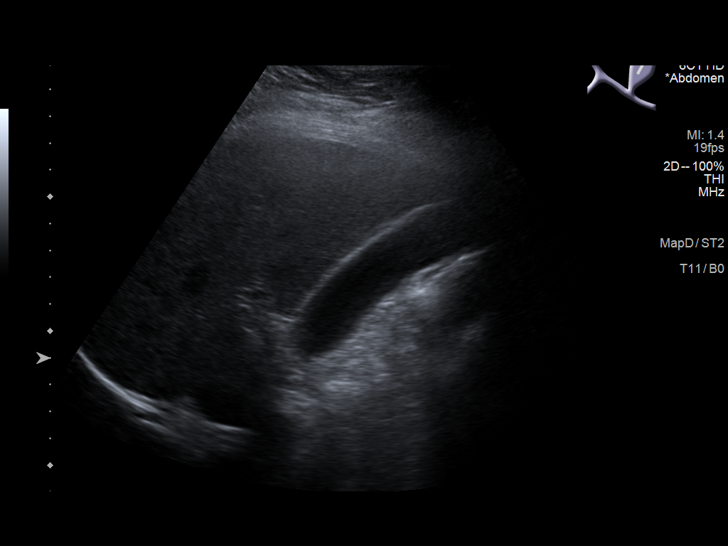
[im 3/36]
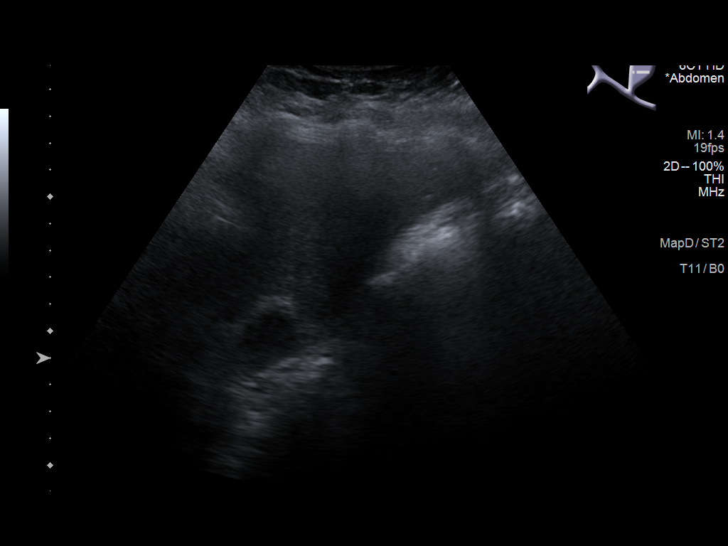
[im 6/36]
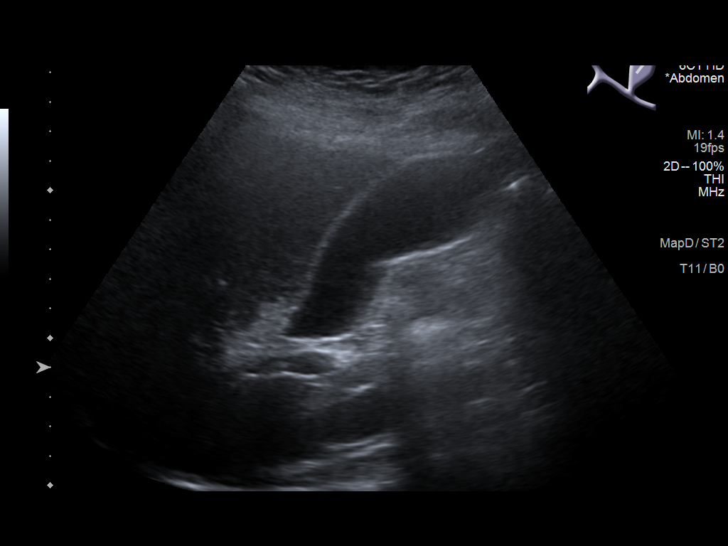
[im 9/36]
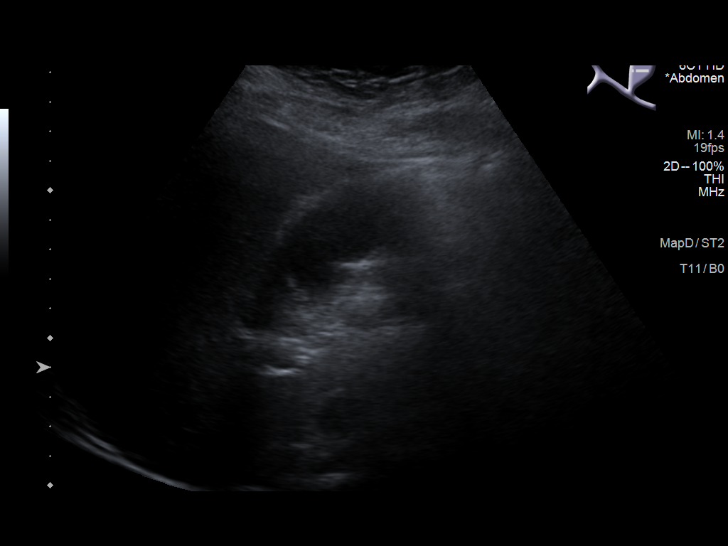
[im 12/36]
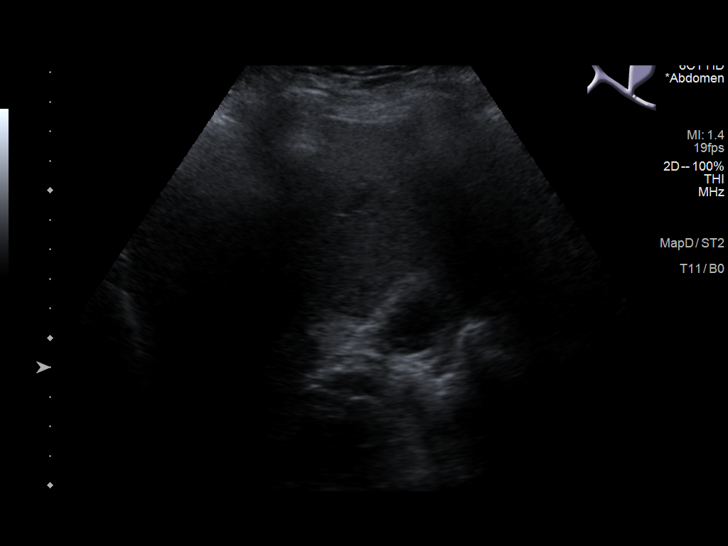
[im 14/36]
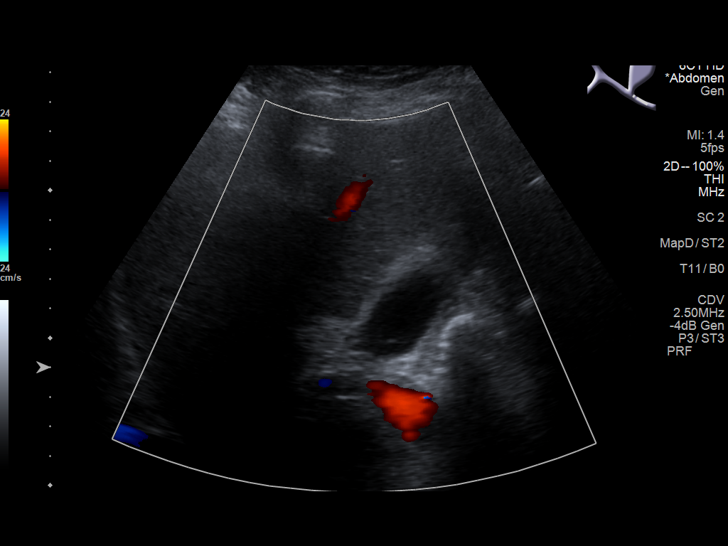
[im 17/36]
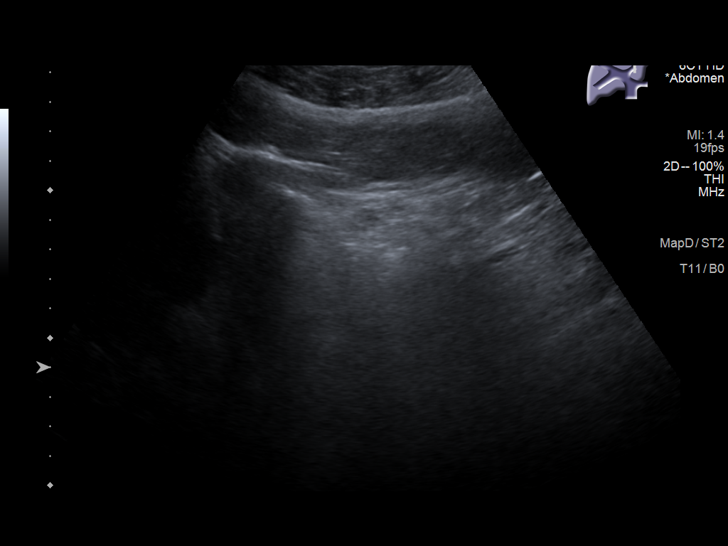
[im 19/36]
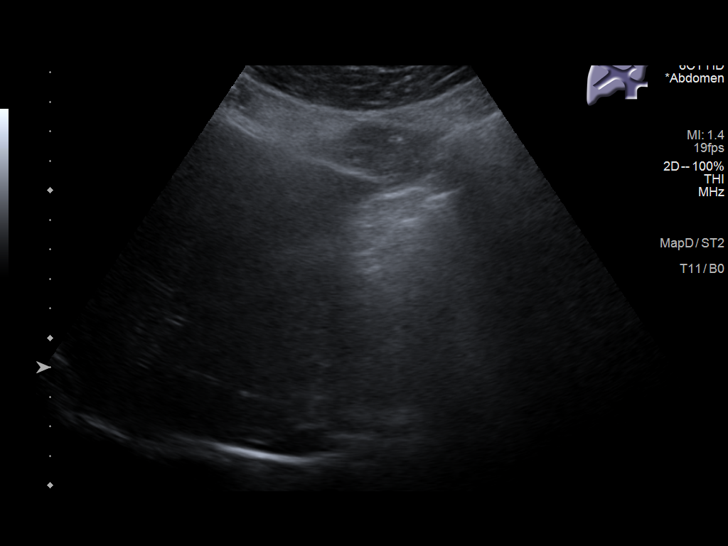
[im 22/36]
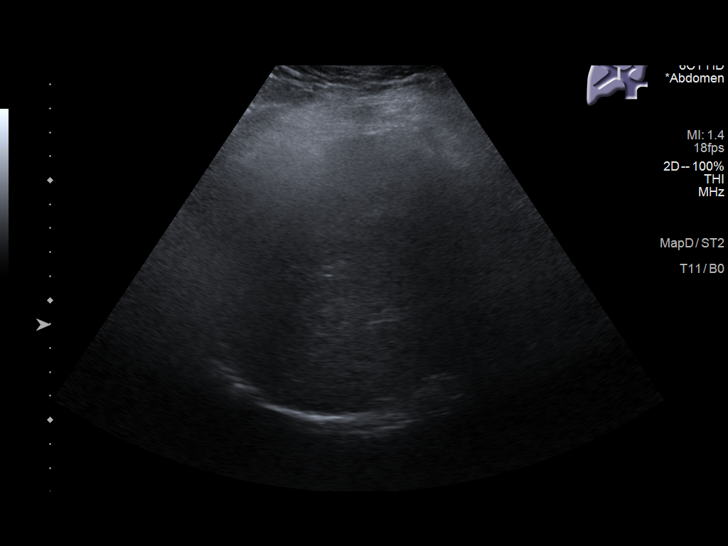
[im 24/36]
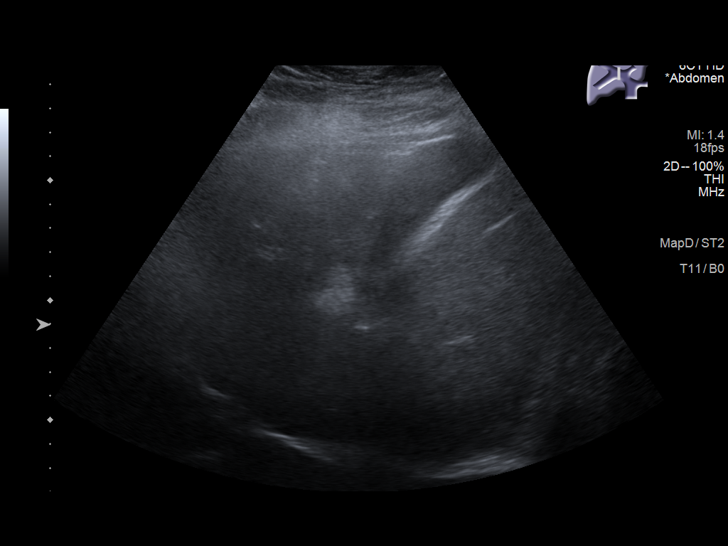
[im 27/36]
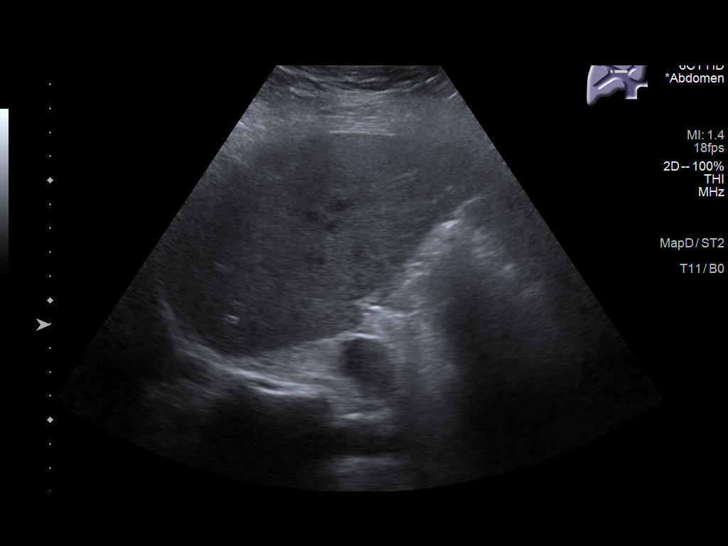
[im 30/36]
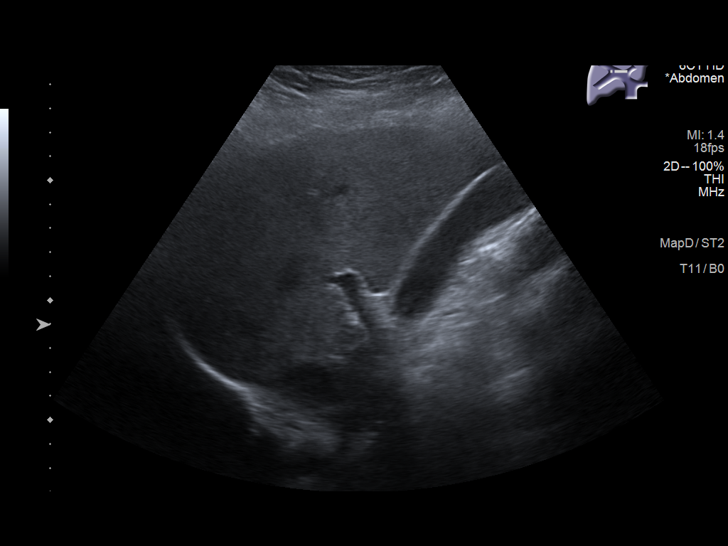
[im 33/36]
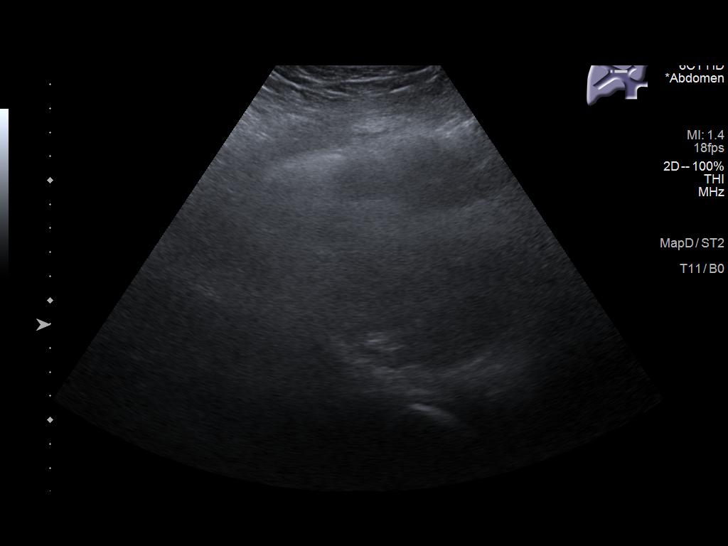
[im 36/36]
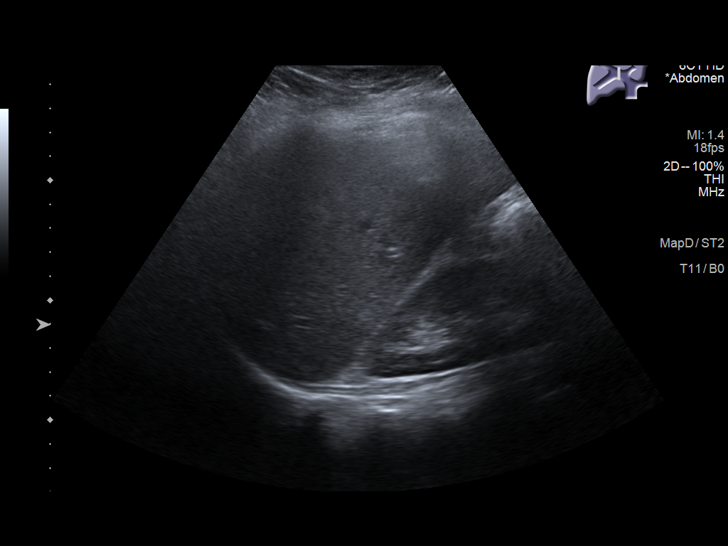

[14 of 25 positions shown; findings below may reference images not displayed]

FINDINGS: Gallbladder:

Normally distended without stones or wall thickening. No
pericholecystic fluid or sonographic Murphy sign.

Common bile duct:

Diameter: Normal caliber 5 mm diameter

Liver:

Echogenic parenchyma, likely fatty infiltration though this can be
seen with cirrhosis and certain infiltrative disorders. No definite
hepatic mass or nodularity identified. Assessment of intrahepatic
detail is slightly limited by sound attenuation. Portal vein is
patent on color Doppler imaging with normal direction of blood flow
towards the liver.

Other: No RIGHT upper quadrant free fluid.
IMPRESSION: Probable fatty infiltration of liver as above.

Otherwise negative exam.

## 2022-09-25 ENCOUNTER — Other Ambulatory Visit: Payer: Self-pay | Admitting: Sports Medicine

## 2022-09-25 DIAGNOSIS — Z87828 Personal history of other (healed) physical injury and trauma: Secondary | ICD-10-CM

## 2022-09-25 DIAGNOSIS — M79604 Pain in right leg: Secondary | ICD-10-CM

## 2022-10-12 ENCOUNTER — Other Ambulatory Visit: Payer: BC Managed Care – PPO
